# Patient Record
Sex: Male | Born: 1980 | Smoking: Current some day smoker
Health system: Southern US, Community
[De-identification: ages and names within clinical notes are randomized; demographics above are authoritative.]

---

## 2015-03-19 ENCOUNTER — Ambulatory Visit (INDEPENDENT_AMBULATORY_CARE_PROVIDER_SITE_OTHER): Payer: 59 | Admitting: Podiatry

## 2015-03-19 ENCOUNTER — Encounter: Payer: Self-pay | Admitting: Podiatry

## 2015-03-19 VITALS — BP 123/78 | HR 90 | Resp 16 | Ht 71.0 in | Wt 210.0 lb

## 2015-03-19 DIAGNOSIS — L603 Nail dystrophy: Secondary | ICD-10-CM | POA: Diagnosis not present

## 2015-03-19 DIAGNOSIS — E232 Diabetes insipidus: Secondary | ICD-10-CM | POA: Insufficient documentation

## 2015-03-19 NOTE — Progress Notes (Signed)
   Subjective:    Patient ID: Elijah Koch, male    DOB: 09/25/1981, 34 y.o.   MRN: [id number]  HPI bilateral great toenails are discolored. No pain to them .     Review of Systems     Objective:   Physical Exam: I have reviewed his past medical history medications allergies surgery social history and review of systems. Pulses are strongly palpable bilateral. Neurologic sensorium is intact per Semmes-Weinstein monofilament. Deep tendon reflexes are intact bilateral and muscle strength was 5 over 5 dorsiflexion plantar flexors and inverters everters all intrinsic musculature is intact. Orthopedic evaluation demonstrates all joints distal to the ankle for range of motion without crepitation. Cutaneous evaluation demonstrates supple well-hydrated cutis with exception of the nail plates which do demonstrate thickened discoloration with sharp incurvated nail margins along the tibial and fibular borders of the hallux and second toes bilaterally. There is no subungual debris and the nails are not brittle. I do not see any signs of dermatitis to the skin.        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 Rule out onychomycosis.  Plan: Samples of the nail and skin were taken today to be sent for pathologic evaluation we will notify him of the results in the near future.

## 2015-04-09 ENCOUNTER — Telehealth: Payer: Self-pay | Admitting: *Deleted

## 2015-04-09 NOTE — Telephone Encounter (Signed)
Final results of pt's 03/19/2015 faxed to [facility], and scanned.

## 2015-04-12 NOTE — Telephone Encounter (Signed)
Received BAKO results-Tammy to schedule appt to discuss results and treatment for positive fungal culture.

## 2015-05-10 ENCOUNTER — Ambulatory Visit (INDEPENDENT_AMBULATORY_CARE_PROVIDER_SITE_OTHER): Payer: 59 | Admitting: Podiatry

## 2015-05-10 DIAGNOSIS — Z79899 Other long term (current) drug therapy: Secondary | ICD-10-CM | POA: Diagnosis not present

## 2015-05-10 MED ORDER — TERBINAFINE HCL 250 MG PO TABS: 250.0000 mg | ORAL_TABLET | Freq: Every day | ORAL | Status: DC

## 2015-05-10 NOTE — Progress Notes (Signed)
He presents today for follow-up of his lab report. He relates no changes in his past medical history medications or allergies.  Objective: Vital signs are stable he is alert and oriented 3. No change in physical exam. Pathology report was positive for onychomycosis.  Assessment: Onychomycosis her lab report.  Plan: Discussed etiology pathology conservative versus surgical therapies at this point we started him on terbinafine which should be at about 60% effective. We also performed a hepatic liver function test and I will follow-up with him should this come back abnormal. Otherwise a prescription for Lamisil was provided 250 mg tablets #30 one by mouth daily. Follow-up with him in 1 month

## 2015-05-12 LAB — CBC WITH DIFFERENTIAL/PLATELET
Basophils Absolute: 0 10*3/uL (ref 0.0–0.2)
Basos: 0 %
EOS (ABSOLUTE): 0.1 10*3/uL (ref 0.0–0.4)
Eos: 1 %
HEMATOCRIT: 44.6 % (ref 37.5–51.0)
Hemoglobin: 15.7 g/dL (ref 12.6–17.7)
Immature Grans (Abs): 0 10*3/uL (ref 0.0–0.1)
Immature Granulocytes: 0 %
LYMPHS ABS: 1.2 10*3/uL (ref 0.7–3.1)
Lymphs: 18 %
MCH: 32.4 pg (ref 26.6–33.0)
MCHC: 35.2 g/dL (ref 31.5–35.7)
MCV: 92 fL (ref 79–97)
MONOCYTES: 6 %
Monocytes Absolute: 0.4 10*3/uL (ref 0.1–0.9)
Neutrophils Absolute: 5 10*3/uL (ref 1.4–7.0)
Neutrophils: 75 %
Platelets: 206 10*3/uL (ref 150–379)
RBC: 4.85 x10E6/uL (ref 4.14–5.80)
RDW: 13.2 % (ref 12.3–15.4)
WBC: 6.7 10*3/uL (ref 3.4–10.8)

## 2015-05-13 LAB — HEPATIC FUNCTION PANEL
ALT: 40 IU/L (ref 0–44)
AST: 16 IU/L (ref 0–40)
Albumin: 4.5 g/dL (ref 3.5–5.5)
Alkaline Phosphatase: 64 IU/L (ref 39–117)
BILIRUBIN TOTAL: 0.5 mg/dL (ref 0.0–1.2)
BILIRUBIN, DIRECT: 0.14 mg/dL (ref 0.00–0.40)
Total Protein: 6.7 g/dL (ref 6.0–8.5)

## 2015-05-14 NOTE — Progress Notes (Signed)
Patient notified

## 2015-06-07 ENCOUNTER — Encounter: Payer: Self-pay | Admitting: Podiatry

## 2015-06-07 ENCOUNTER — Ambulatory Visit (INDEPENDENT_AMBULATORY_CARE_PROVIDER_SITE_OTHER): Payer: 59 | Admitting: Podiatry

## 2015-06-07 VITALS — BP 123/72 | HR 92 | Resp 12

## 2015-06-07 DIAGNOSIS — Z79899 Other long term (current) drug therapy: Secondary | ICD-10-CM

## 2015-06-07 MED ORDER — TERBINAFINE HCL 250 MG PO TABS: 250.0000 mg | ORAL_TABLET | Freq: Every day | ORAL | Status: AC

## 2015-06-07 NOTE — Progress Notes (Signed)
Demareon presents today for follow-up of his onychomycosis. He's been taking Lamisil for 1 month now. He denies fever chills nausea vomiting muscle aches pains itching or rashes.  Objective: Onychomycosis.  Assessment: encounter long-term therapy for onychomycosis.  Plan: Request another liver profile and write a prescription for Lamisil No. 90. I will follow him in 4 months.  Arbutus Ped DPM

## 2015-10-06 ENCOUNTER — Encounter (INDEPENDENT_AMBULATORY_CARE_PROVIDER_SITE_OTHER): Payer: 59 | Admitting: Podiatry

## 2015-10-06 NOTE — Progress Notes (Signed)
This encounter was created in error - please disregard.

## 2020-11-16 ENCOUNTER — Other Ambulatory Visit: Payer: Self-pay | Admitting: Orthopedic Surgery

## 2020-11-16 DIAGNOSIS — S83212A Bucket-handle tear of medial meniscus, current injury, left knee, initial encounter: Secondary | ICD-10-CM

## 2020-11-26 ENCOUNTER — Ambulatory Visit
Admission: RE | Admit: 2020-11-26 | Discharge: 2020-11-26 | Disposition: A | Discharge status: Home / Self Care | Payer: PRIVATE HEALTH INSURANCE | Source: Ambulatory Visit | Attending: Orthopedic Surgery | Admitting: Orthopedic Surgery

## 2020-11-26 ENCOUNTER — Other Ambulatory Visit: Payer: Self-pay

## 2020-11-26 DIAGNOSIS — S83212A Bucket-handle tear of medial meniscus, current injury, left knee, initial encounter: Secondary | ICD-10-CM | POA: Diagnosis present

## 2020-11-29 ENCOUNTER — Other Ambulatory Visit: Payer: Self-pay | Admitting: Orthopedic Surgery

## 2020-11-29 DIAGNOSIS — M25569 Pain in unspecified knee: Secondary | ICD-10-CM

## 2020-12-08 ENCOUNTER — Ambulatory Visit
Admission: RE | Admit: 2020-12-08 | Discharge: 2020-12-08 | Disposition: A | Discharge status: Home / Self Care | Payer: PRIVATE HEALTH INSURANCE | Attending: Orthopedic Surgery | Admitting: Orthopedic Surgery

## 2020-12-08 ENCOUNTER — Ambulatory Visit
Admission: RE | Admit: 2020-12-08 | Discharge: 2020-12-08 | Disposition: A | Discharge status: Home / Self Care | Payer: PRIVATE HEALTH INSURANCE | Source: Ambulatory Visit | Attending: Orthopedic Surgery | Admitting: Orthopedic Surgery

## 2020-12-08 DIAGNOSIS — M25569 Pain in unspecified knee: Secondary | ICD-10-CM | POA: Diagnosis present

## 2022-04-24 IMAGING — MR MR KNEE*L* W/O CM
6 series · 40 of 40 positions shown · non-contrast
Comparison: None.

CLINICAL DATA: Left medial knee pain for a few months.

EXAM:
MRI OF THE LEFT KNEE WITHOUT CONTRAST
TECHNIQUE: Multiplanar, multisequence MR imaging of the knee was performed. No
intravenous contrast was administered.

[Series 8: T2 fat-sat · axial · left · 4.0mm · 0.50mm/px · z∈[-78,+46]mm · 8 of 26 slices shown (1 of 3)]
[im 1/26]
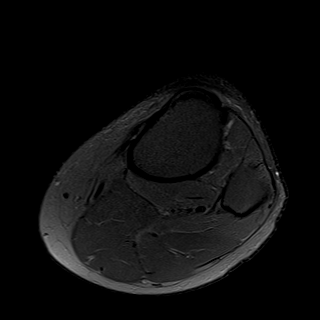
[im 4/26]
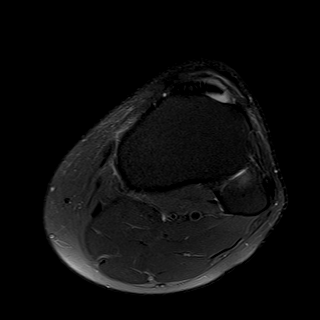
[im 8/26]
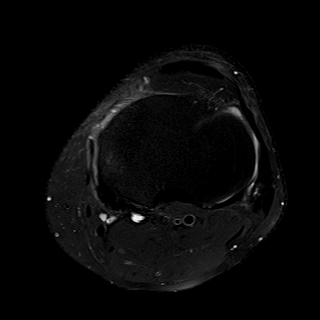
[im 11/26]
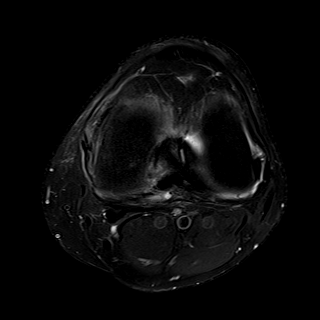
[im 15/26]
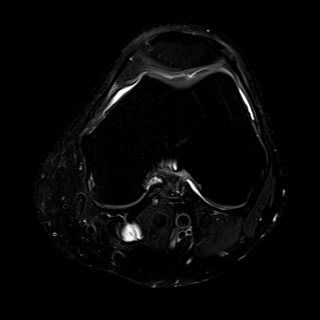
[im 18/26]
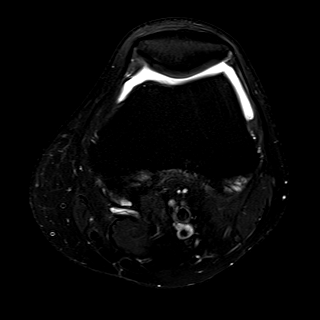
[im 22/26]
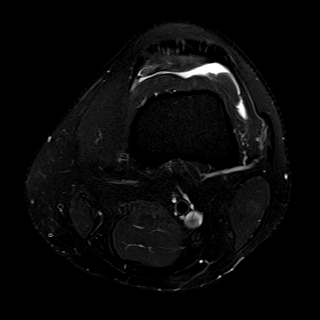
[im 26/26]
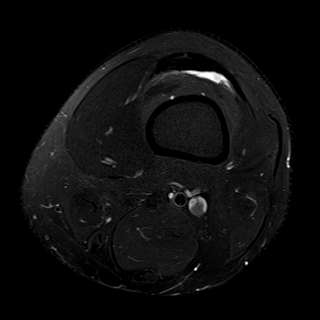

[Series 9: T2 fat-sat · coronal · left · 4.0mm · 0.59mm/px · 6 of 24 slices shown (2 of 3)]
[im 1/24]
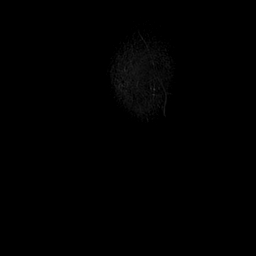
[im 5/24]
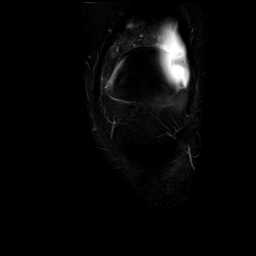
[im 10/24]
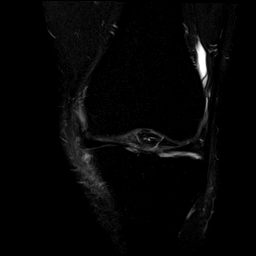
[im 14/24]
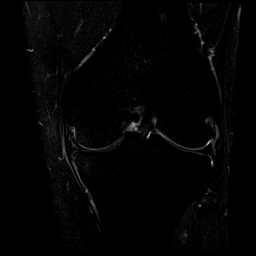
[im 19/24]
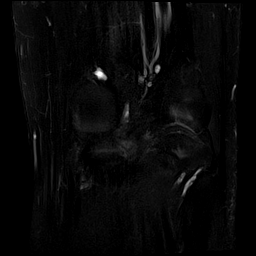
[im 24/24]
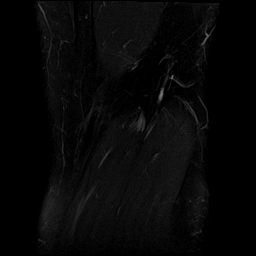

[Series 10: T1 · coronal · left · 4.0mm · 0.59mm/px · 6 of 24 slices shown]
[im 1/24]
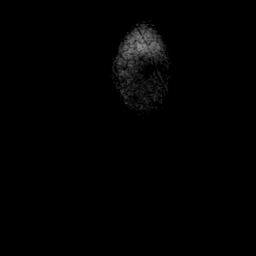
[im 5/24]
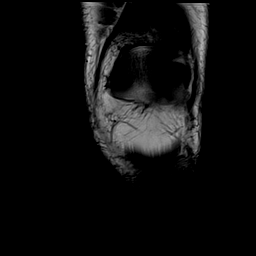
[im 10/24]
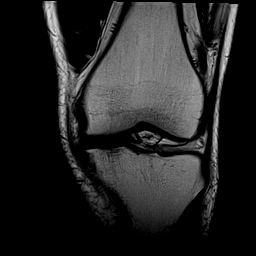
[im 14/24]
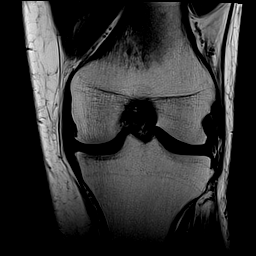
[im 19/24]
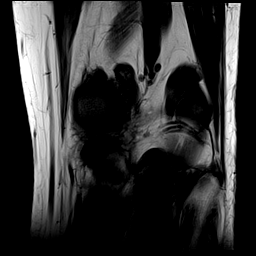
[im 24/24]
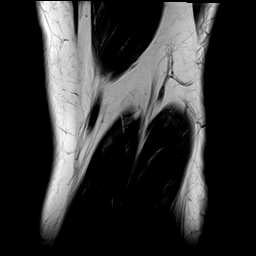

[Series 11: PD fat-sat · coronal · left · 4.0mm · 0.59mm/px · 6 of 24 slices shown (1 of 2)]
[im 1/24]
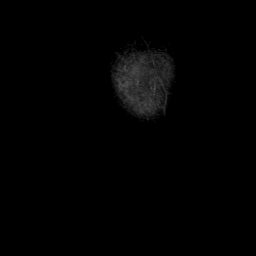
[im 5/24]
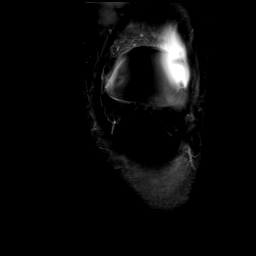
[im 10/24]
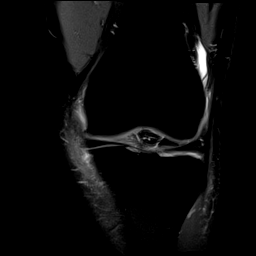
[im 14/24]
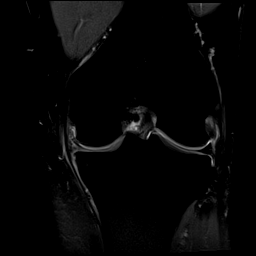
[im 19/24]
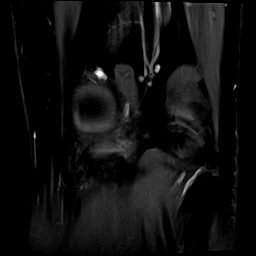
[im 24/24]
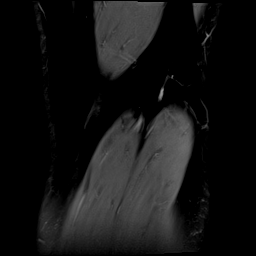

[Series 12: PD fat-sat · sagittal · left · 3.0mm · 0.59mm/px · 7 of 29 slices shown (2 of 2)]
[im 1/29]
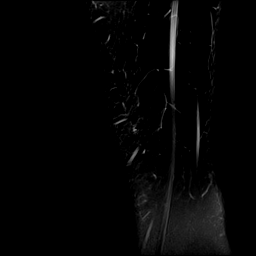
[im 5/29]
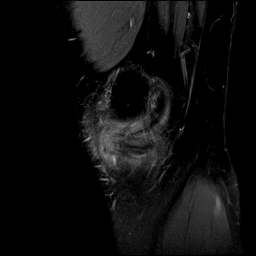
[im 10/29]
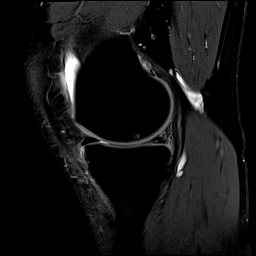
[im 15/29]
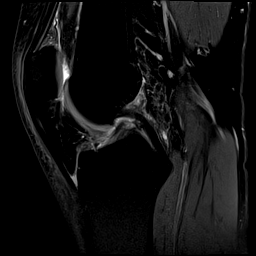
[im 19/29]
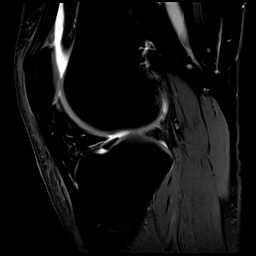
[im 24/29]
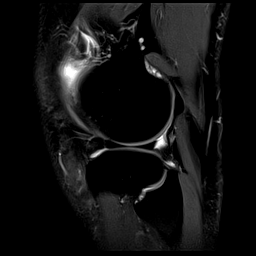
[im 29/29]
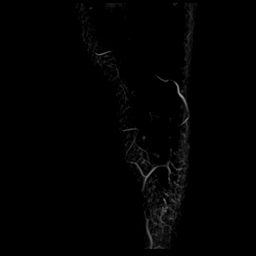

[Series 13: T2 fat-sat · sagittal · left · 3.0mm · 0.59mm/px · 7 of 29 slices shown (3 of 3)]
[im 1/29]
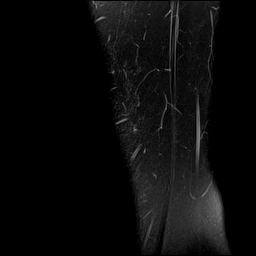
[im 5/29]
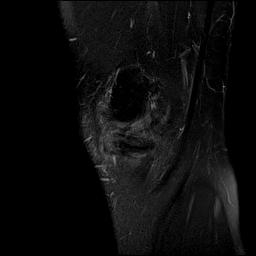
[im 10/29]
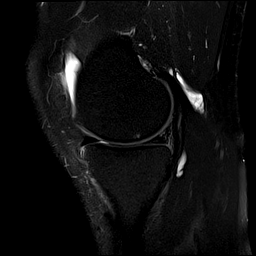
[im 15/29]
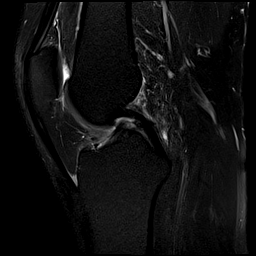
[im 19/29]
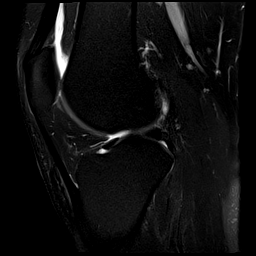
[im 24/29]
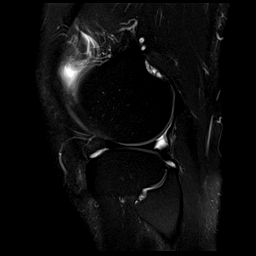
[im 29/29]
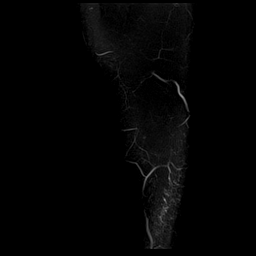

[40 of 40 positions shown; findings below may reference images not displayed]

FINDINGS: MENISCI

Medial meniscus: Complex and fairly extensive tear involving the
medial meniscus. There is a partial thickness radial tear back near
the meniscal root. There is also an oblique coursing inferior
articular surface tear involving the posterior horn mid body
junction region.

Lateral meniscus:  Intact

LIGAMENTS

Cruciates:  Intact

Collaterals:  Intact.  MCL and pes anserine bursitis noted.

CARTILAGE

Patellofemoral:  Normal

Medial: Mild degenerative chondrosis for age with early joint space
narrowing and spurring.

Lateral:  Normal

Joint:  Small joint effusion.  Medial patellar plica noted.

Popliteal Fossa:  Small Baker's cyst.

Extensor Mechanism: The patella retinacular structures are intact
and the quadriceps and patellar tendons are intact.

Bones:  No acute bony findings.

Other: Unremarkable knee musculature.
IMPRESSION: 1. Complex and fairly extensive tear involving the medial meniscus.
2. Intact ligamentous structures and no acute bony findings.
3. Mild medial compartment degenerative chondrosis for age.
4. Small joint effusion and small Baker's cyst.
